# Patient Record
Sex: Female | Born: 1990 | Race: White | Hispanic: No | Marital: Single | Smoking: Never smoker
Health system: Southern US, Community
[De-identification: ages and names within clinical notes are randomized; demographics above are authoritative.]

## PROBLEM LIST (undated history)

## (undated) HISTORY — PX: TONSILLECTOMY: SUR1361

---
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
## 2008-12-31 ENCOUNTER — Ambulatory Visit (HOSPITAL_COMMUNITY): Admission: RE | Admit: 2008-12-31 | Discharge: 2008-12-31 | Payer: Self-pay | Admitting: Obstetrics

## 2009-06-18 ENCOUNTER — Inpatient Hospital Stay (HOSPITAL_COMMUNITY): Admission: AD | Admit: 2009-06-18 | Discharge: 2009-06-21 | Payer: Self-pay | Admitting: Obstetrics

## 2010-03-30 LAB — RPR: RPR Ser Ql: NONREACTIVE

## 2010-03-30 LAB — CBC
HCT: 27.9 % — ABNORMAL LOW (ref 36.0–46.0)
HCT: 31.7 % — ABNORMAL LOW (ref 36.0–46.0)
Hemoglobin: 10.9 g/dL — ABNORMAL LOW (ref 12.0–15.0)
Hemoglobin: 9.9 g/dL — ABNORMAL LOW (ref 12.0–15.0)
MCHC: 35.6 g/dL (ref 30.0–36.0)
MCV: 95.6 fL (ref 78.0–100.0)
RBC: 2.92 MIL/uL — ABNORMAL LOW (ref 3.87–5.11)
RBC: 3.3 MIL/uL — ABNORMAL LOW (ref 3.87–5.11)
RDW: 13.3 % (ref 11.5–15.5)
RDW: 13.4 % (ref 11.5–15.5)
WBC: 10.2 10*3/uL (ref 4.0–10.5)

## 2010-07-25 IMAGING — US US OB DETAIL+14 WK
1 series · 14 of 28 positions shown · non-contrast
Comparison: none

OBSTETRICAL ULTRASOUND:
 This ultrasound exam was performed in the [HOSPITAL] Ultrasound Department.  The OB US report was generated in the AS system, and faxed to the ordering physician.  This report is also available in [HOSPITAL]?s Access[employer]ware and in [REDACTED] PACS.

[Series 1: us ob detail +14 wk · 0.19mm/px · 98 acquisitions, 14 frames shown]
[im 4/98]
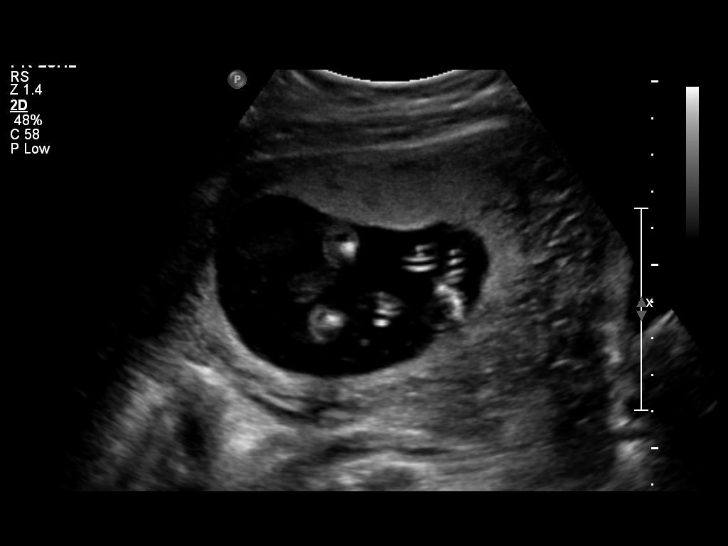
[im 11/98]
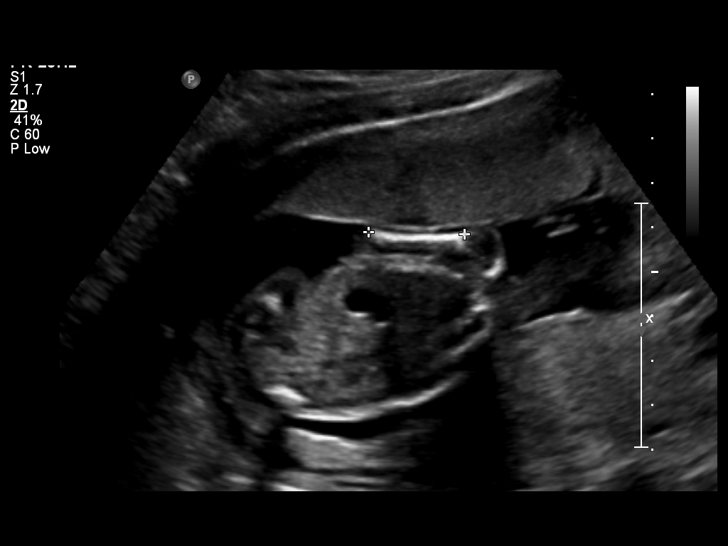
[im 18/98]
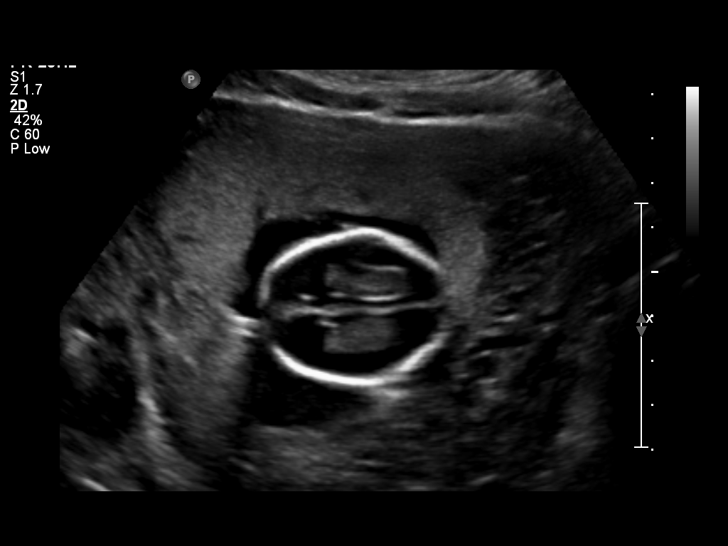
[im 26/98]
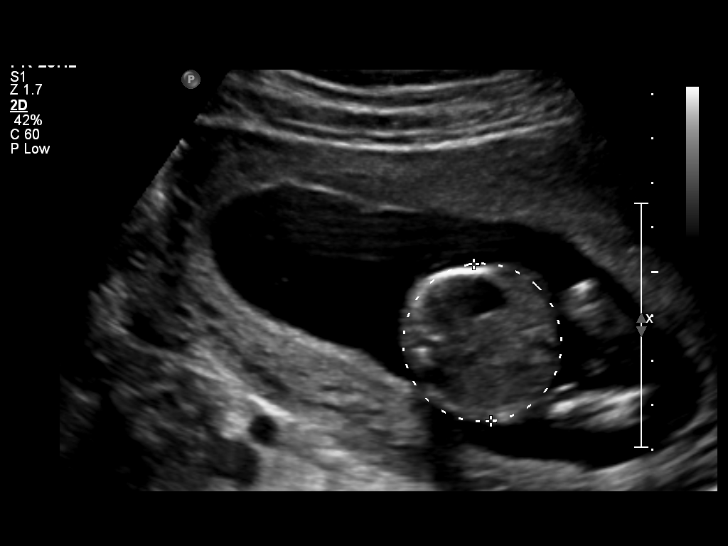
[im 33/98]
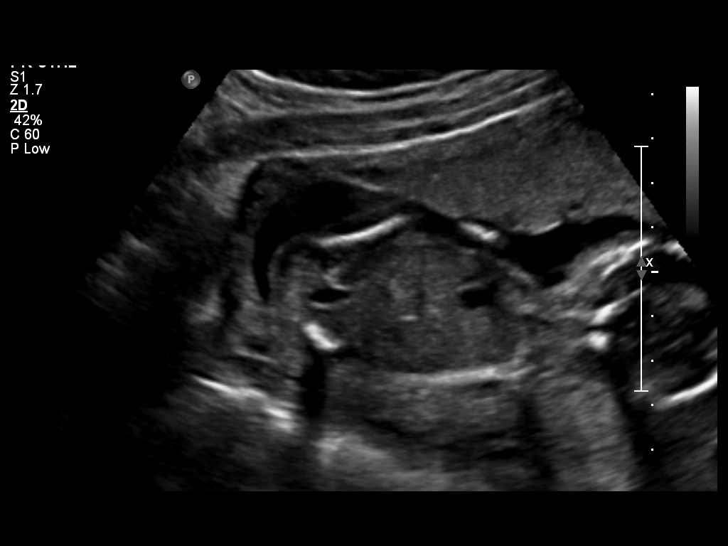
[im 40/98]
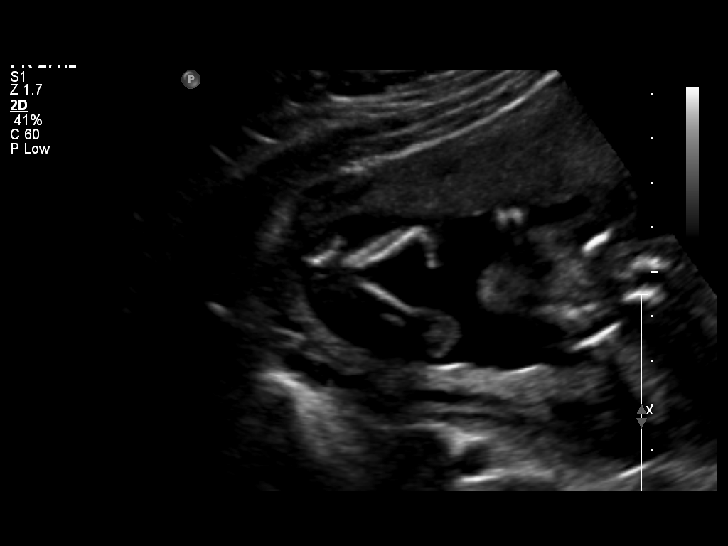
[im 47/98]
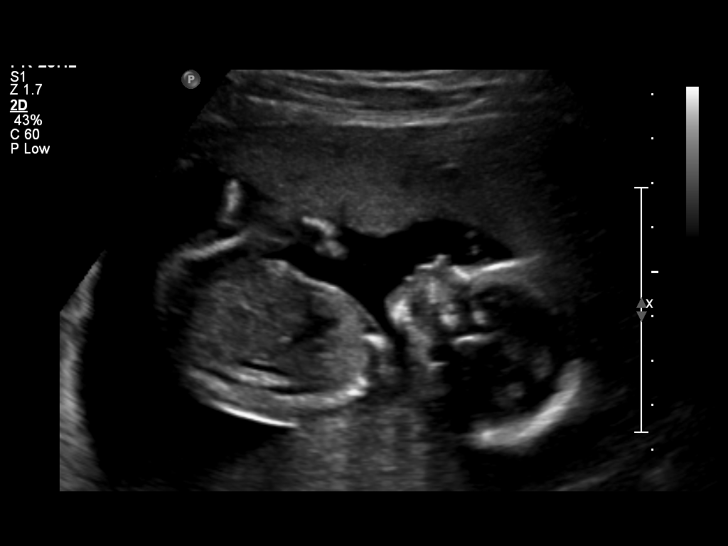
[im 54/98]
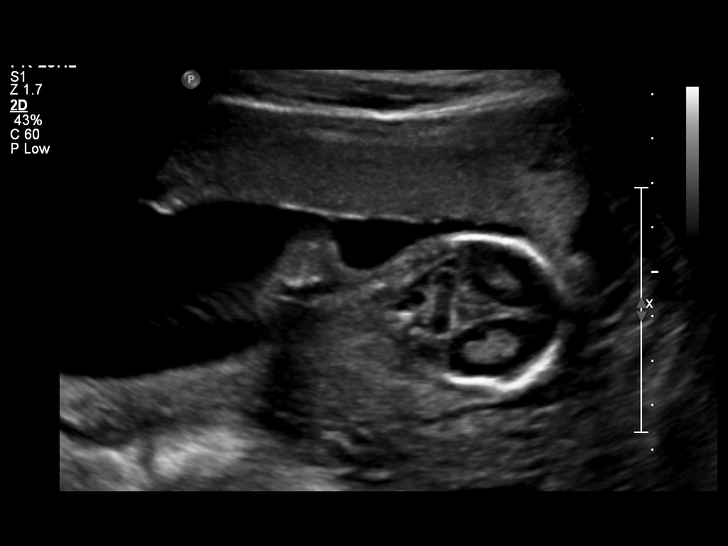
[im 62/98]
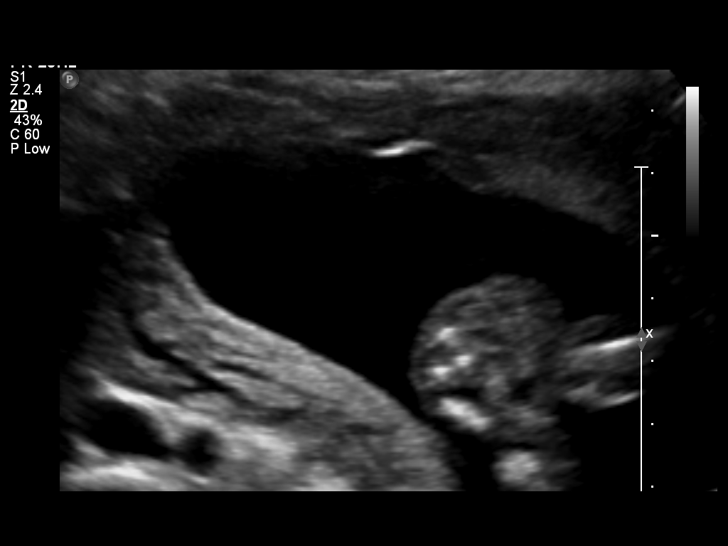
[im 69/98]
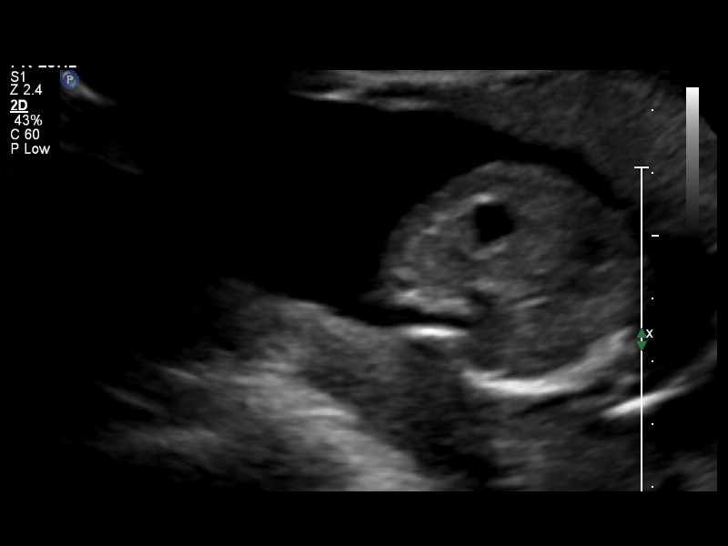
[im 76/98]
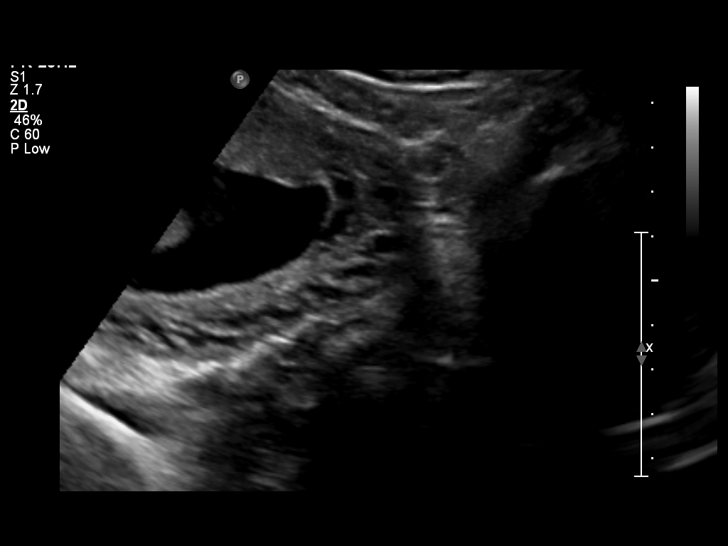
[im 83/98]
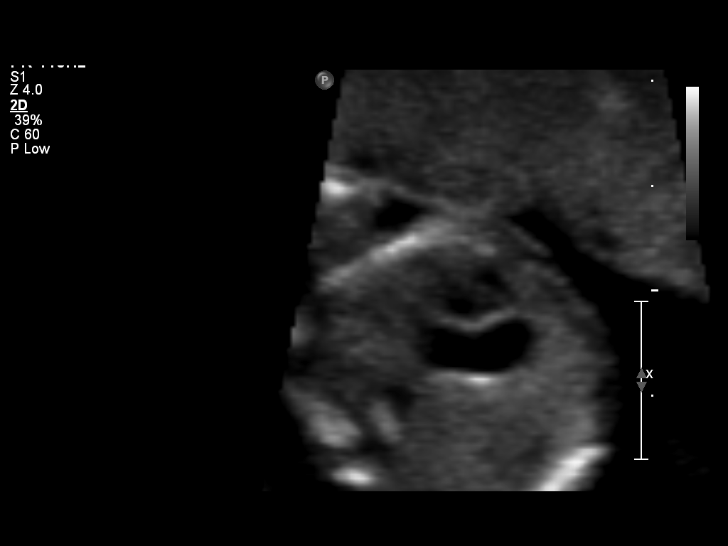
[im 90/98]
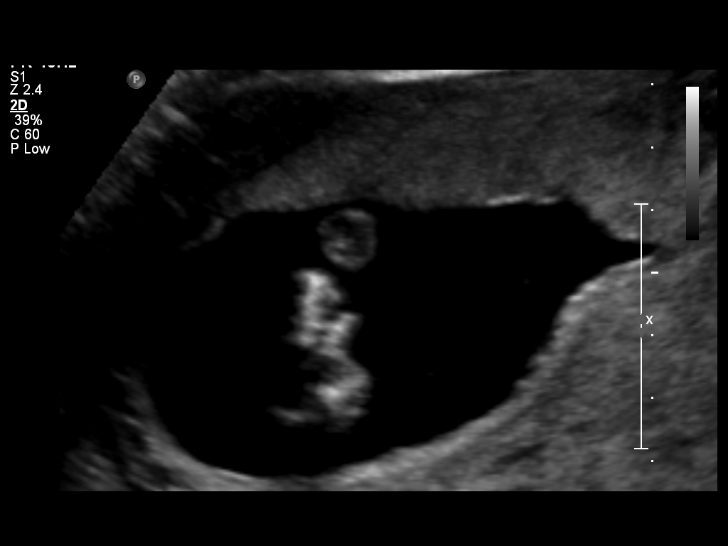
[im 98/98]
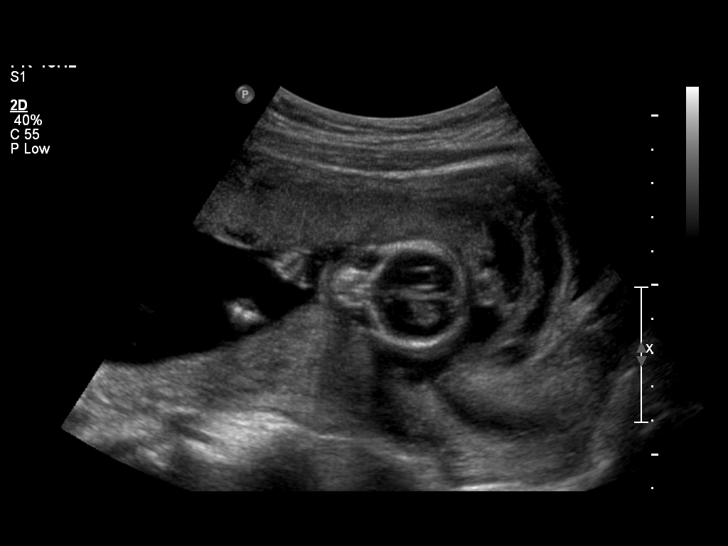

[14 of 28 positions shown; findings below may reference images not displayed]

IMPRESSION: See AS Obstetric US report.

## 2012-01-12 NOTE — L&D Delivery Note (Signed)
Delivery Note At 11:53 PM a viable female was delivered via Vaginal, Spontaneous Delivery (Presentation: Left Occiput Anterior).  APGAR: 8, 9; weight: 3005 grams .   Placenta status: Intact, Expressed.  Cord: 3 vessels with the following complications: None.  Cord pH: none  Anesthesia: Epidural  Episiotomy: None Lacerations: 1st degree Suture Repair: 3.0 vicryl Est. Blood Loss (mL): 350  Mom to postpartum.  Baby to skin to skin with mom immediately postpartum, then to nursery per protocol.[name]  Diamond [name] A 11/01/2012, 12:29 AM

## 2012-08-07 ENCOUNTER — Ambulatory Visit (INDEPENDENT_AMBULATORY_CARE_PROVIDER_SITE_OTHER): Payer: Medicaid Other | Admitting: Obstetrics

## 2012-08-07 ENCOUNTER — Encounter: Payer: Self-pay | Admitting: Obstetrics

## 2012-08-07 VITALS — BP 117/75 | Temp 98.6°F | Ht 60.0 in | Wt 159.0 lb

## 2012-08-07 DIAGNOSIS — Z3482 Encounter for supervision of other normal pregnancy, second trimester: Secondary | ICD-10-CM

## 2012-08-07 DIAGNOSIS — Z369 Encounter for antenatal screening, unspecified: Secondary | ICD-10-CM

## 2012-08-07 DIAGNOSIS — A6000 Herpesviral infection of urogenital system, unspecified: Secondary | ICD-10-CM

## 2012-08-07 DIAGNOSIS — Z3483 Encounter for supervision of other normal pregnancy, third trimester: Secondary | ICD-10-CM

## 2012-08-07 DIAGNOSIS — Z113 Encounter for screening for infections with a predominantly sexual mode of transmission: Secondary | ICD-10-CM

## 2012-08-07 DIAGNOSIS — Z3480 Encounter for supervision of other normal pregnancy, unspecified trimester: Secondary | ICD-10-CM

## 2012-08-07 DIAGNOSIS — A6009 Herpesviral infection of other urogenital tract: Secondary | ICD-10-CM

## 2012-08-07 LAB — POCT URINALYSIS DIPSTICK
Bilirubin, UA: NEGATIVE
Blood, UA: NEGATIVE
Ketones, UA: NEGATIVE
pH, UA: 7

## 2012-08-07 MED ORDER — VITAFOL ULTRA 29-0.6-0.4-200 MG PO CAPS: 1.0000 | ORAL_CAPSULE | Freq: Every day | ORAL | Status: DC

## 2012-08-07 MED ORDER — VALACYCLOVIR HCL 1 G PO TABS: 1000.0000 mg | ORAL_TABLET | Freq: Every day | ORAL | Status: DC

## 2012-08-07 NOTE — Progress Notes (Signed)
P- 94 Pt states she is having pelvic ain. Pt states she is also having a greenish mucousy discharge.  Subjective:    Diamond [name] is being seen today for her first obstetrical visit.  This is not a planned pregnancy. She is at [redacted]w[redacted]d gestation. Her obstetrical history is significant for none. Relationship with FOB: significant other, not living together. Patient does not intend to breast feed. Pregnancy history fully reviewed.  Menstrual History: OB History   Grav Para Term Preterm Abortions TAB SAB Ect Mult Living   2 1 1       1       Last Pap: 2013- WNL Menarche age: 68 Regular  Patient's last menstrual period was 01/18/2012.    The following portions of the patient's history were reviewed and updated as appropriate: allergies, current medications, past family history, past medical history, past social history, past surgical history and problem list.  Review of Systems Pertinent items are noted in HPI.    Objective:    General appearance: alert and no distress Abdomen: normal findings: soft, non-tender Pelvic: cervix normal in appearance, external genitalia normal, no adnexal masses or tenderness, no cervical motion tenderness, vagina normal without discharge and uterus enlarged, soft, NT.    Assessment:    Pregnancy at [redacted]w[redacted]d weeks    Plan:    Initial labs drawn. Prenatal vitamins.  Counseling provided regarding continued use of seat belts, cessation of alcohol consumption, smoking or use of illicit drugs; infection precautions i.e., influenza/TDAP immunizations, toxoplasmosis,CMV, parvovirus, listeria and varicella; workplace safety, exercise during pregnancy; routine dental care, safe medications, sexual activity, hot tubs, saunas, pools, travel, caffeine use, fish and methlymercury, potential toxins, hair treatments, varicose veins Weight gain recommendations reviewed: underweight/BMI< 18.5--> gain 28 - 40 lbs; normal weight/BMI 18.5 - 24.9--> gain 25 - 35 lbs;  overweight/BMI 25 - 29.9--> gain 15 - 25 lbs; obese/BMI >30->gain  11 - 20 lbs Problem list reviewed and updated. AFP3 discussed: n/a. Role of ultrasound in pregnancy discussed; fetal survey: requested. Amniocentesis discussed: not indicated. Follow up in 2 weeks. 50% of 20 min visit spent on counseling and coordination of care.

## 2012-08-08 ENCOUNTER — Ambulatory Visit (INDEPENDENT_AMBULATORY_CARE_PROVIDER_SITE_OTHER): Payer: Medicaid Other

## 2012-08-08 ENCOUNTER — Other Ambulatory Visit: Payer: Medicaid Other

## 2012-08-08 ENCOUNTER — Encounter: Payer: Self-pay | Admitting: Obstetrics

## 2012-08-08 DIAGNOSIS — Z3480 Encounter for supervision of other normal pregnancy, unspecified trimester: Secondary | ICD-10-CM

## 2012-08-08 DIAGNOSIS — Z369 Encounter for antenatal screening, unspecified: Secondary | ICD-10-CM

## 2012-08-08 DIAGNOSIS — Z3482 Encounter for supervision of other normal pregnancy, second trimester: Secondary | ICD-10-CM

## 2012-08-08 LAB — GLUCOSE TOLERANCE, 2 HOURS W/ 1HR
Glucose, 1 hour: 117 mg/dL (ref 70–170)
Glucose, Fasting: 64 mg/dL — ABNORMAL LOW (ref 70–99)

## 2012-08-08 LAB — VARICELLA ZOSTER ANTIBODY, IGG: Varicella IgG: 209.5 Index — ABNORMAL HIGH (ref <135.00)

## 2012-08-08 LAB — OBSTETRIC PANEL
Basophils Relative: 0 % (ref 0–1)
Eosinophils Absolute: 0.1 10*3/uL (ref 0.0–0.7)
Eosinophils Relative: 2 % (ref 0–5)
Lymphs Abs: 2.6 10*3/uL (ref 0.7–4.0)
MCH: 30.5 pg (ref 26.0–34.0)
MCHC: 32.7 g/dL (ref 30.0–36.0)
MCV: 93.1 fL (ref 78.0–100.0)
Neutrophils Relative %: 60 % (ref 43–77)
Platelets: 280 10*3/uL (ref 150–400)
RBC: 3.48 MIL/uL — ABNORMAL LOW (ref 3.87–5.11)
Rh Type: POSITIVE

## 2012-08-08 LAB — CBC
MCH: 31.2 pg (ref 26.0–34.0)
MCHC: 34.4 g/dL (ref 30.0–36.0)
Platelets: 268 10*3/uL (ref 150–400)
RDW: 13.6 % (ref 11.5–15.5)

## 2012-08-08 LAB — HIV ANTIBODY (ROUTINE TESTING W REFLEX)
HIV: NONREACTIVE
HIV: NONREACTIVE

## 2012-08-08 LAB — WET PREP BY MOLECULAR PROBE
Candida species: NEGATIVE
Trichomonas vaginosis: NEGATIVE

## 2012-08-08 LAB — VITAMIN D 25 HYDROXY (VIT D DEFICIENCY, FRACTURES): Vit D, 25-Hydroxy: 47 ng/mL (ref 30–89)

## 2012-08-08 LAB — US OB DETAIL + 14 WK

## 2012-08-08 LAB — CULTURE, OB URINE: Organism ID, Bacteria: NO GROWTH

## 2012-08-14 ENCOUNTER — Ambulatory Visit (INDEPENDENT_AMBULATORY_CARE_PROVIDER_SITE_OTHER): Payer: Medicaid Other | Admitting: Obstetrics

## 2012-08-14 VITALS — BP 117/69 | Temp 97.4°F | Wt 159.0 lb

## 2012-08-14 DIAGNOSIS — Z3483 Encounter for supervision of other normal pregnancy, third trimester: Secondary | ICD-10-CM

## 2012-08-14 DIAGNOSIS — Z3480 Encounter for supervision of other normal pregnancy, unspecified trimester: Secondary | ICD-10-CM

## 2012-08-14 LAB — POCT URINALYSIS DIPSTICK
Bilirubin, UA: NEGATIVE
Glucose, UA: NEGATIVE
Leukocytes, UA: NEGATIVE
Nitrite, UA: NEGATIVE
pH, UA: 6

## 2012-08-14 NOTE — Progress Notes (Signed)
Plans COCP.  Not planning to breast feed.

## 2012-08-14 NOTE — Progress Notes (Signed)
Pulse-102 No complaints.

## 2012-08-14 NOTE — Patient Instructions (Signed)

## 2012-08-28 ENCOUNTER — Encounter: Payer: Medicaid Other | Admitting: Obstetrics

## 2012-09-12 ENCOUNTER — Ambulatory Visit (INDEPENDENT_AMBULATORY_CARE_PROVIDER_SITE_OTHER): Payer: Medicaid Other | Admitting: Obstetrics

## 2012-09-12 VITALS — BP 119/75 | Temp 97.8°F | Wt 160.0 lb

## 2012-09-12 DIAGNOSIS — Z3483 Encounter for supervision of other normal pregnancy, third trimester: Secondary | ICD-10-CM

## 2012-09-12 DIAGNOSIS — Z3480 Encounter for supervision of other normal pregnancy, unspecified trimester: Secondary | ICD-10-CM

## 2012-09-12 LAB — POCT URINALYSIS DIPSTICK

## 2012-09-12 NOTE — Progress Notes (Signed)
Pulse: 89

## 2012-09-14 ENCOUNTER — Encounter: Payer: Self-pay | Admitting: Obstetrics

## 2012-09-19 ENCOUNTER — Ambulatory Visit (INDEPENDENT_AMBULATORY_CARE_PROVIDER_SITE_OTHER): Payer: Medicaid Other | Admitting: Obstetrics

## 2012-09-19 ENCOUNTER — Encounter: Payer: Self-pay | Admitting: Obstetrics

## 2012-09-19 VITALS — BP 113/76 | Temp 98.0°F | Wt 159.6 lb

## 2012-09-19 DIAGNOSIS — Z3483 Encounter for supervision of other normal pregnancy, third trimester: Secondary | ICD-10-CM

## 2012-09-19 DIAGNOSIS — Z3480 Encounter for supervision of other normal pregnancy, unspecified trimester: Secondary | ICD-10-CM

## 2012-09-19 LAB — POCT URINALYSIS DIPSTICK
Blood, UA: NEGATIVE
Protein, UA: NEGATIVE
Spec Grav, UA: 1.01
Urobilinogen, UA: NEGATIVE

## 2012-09-19 NOTE — Addendum Note (Signed)
Addended by: Elby Beck F on: 09/19/2012 02:46 PM   Modules accepted: Orders

## 2012-09-19 NOTE — Progress Notes (Signed)
Pulse: 85

## 2012-09-21 LAB — STREP B DNA PROBE: GBSP: NEGATIVE

## 2012-09-26 ENCOUNTER — Ambulatory Visit (INDEPENDENT_AMBULATORY_CARE_PROVIDER_SITE_OTHER): Payer: Medicaid Other | Admitting: Obstetrics

## 2012-09-26 VITALS — BP 108/67 | Temp 96.9°F | Wt 161.0 lb

## 2012-09-26 DIAGNOSIS — Z3483 Encounter for supervision of other normal pregnancy, third trimester: Secondary | ICD-10-CM

## 2012-09-26 DIAGNOSIS — Z3480 Encounter for supervision of other normal pregnancy, unspecified trimester: Secondary | ICD-10-CM

## 2012-09-26 LAB — POCT URINALYSIS DIPSTICK
Bilirubin, UA: NEGATIVE
Blood, UA: NEGATIVE
Ketones, UA: NEGATIVE
Spec Grav, UA: 1.015
pH, UA: 7

## 2012-09-26 NOTE — Progress Notes (Signed)
P 94 Patient reports she is doing well.

## 2012-09-27 ENCOUNTER — Encounter: Payer: Self-pay | Admitting: Obstetrics

## 2012-10-03 ENCOUNTER — Ambulatory Visit (INDEPENDENT_AMBULATORY_CARE_PROVIDER_SITE_OTHER): Payer: Medicaid Other | Admitting: Obstetrics

## 2012-10-03 ENCOUNTER — Encounter: Payer: Self-pay | Admitting: Obstetrics

## 2012-10-03 VITALS — Temp 97.0°F

## 2012-10-03 DIAGNOSIS — Z3480 Encounter for supervision of other normal pregnancy, unspecified trimester: Secondary | ICD-10-CM

## 2012-10-03 DIAGNOSIS — Z3483 Encounter for supervision of other normal pregnancy, third trimester: Secondary | ICD-10-CM

## 2012-10-03 LAB — POCT URINALYSIS DIPSTICK
Bilirubin, UA: NEGATIVE
Blood, UA: NEGATIVE
Glucose, UA: NEGATIVE
Ketones, UA: NEGATIVE
Spec Grav, UA: 1.01

## 2012-10-03 NOTE — Progress Notes (Signed)
P=79 

## 2012-10-10 ENCOUNTER — Encounter: Payer: Self-pay | Admitting: Obstetrics

## 2012-10-10 ENCOUNTER — Ambulatory Visit (INDEPENDENT_AMBULATORY_CARE_PROVIDER_SITE_OTHER): Payer: Medicaid Other | Admitting: Obstetrics

## 2012-10-10 VITALS — BP 112/70 | Temp 98.1°F | Wt 159.0 lb

## 2012-10-10 DIAGNOSIS — Z3483 Encounter for supervision of other normal pregnancy, third trimester: Secondary | ICD-10-CM

## 2012-10-10 DIAGNOSIS — Z3480 Encounter for supervision of other normal pregnancy, unspecified trimester: Secondary | ICD-10-CM

## 2012-10-10 LAB — POCT URINALYSIS DIPSTICK
Glucose, UA: NEGATIVE
Ketones, UA: NEGATIVE
Spec Grav, UA: 1.015
Urobilinogen, UA: NEGATIVE

## 2012-10-10 NOTE — Progress Notes (Signed)
P 79 Patient reports she is doing well.

## 2012-10-17 ENCOUNTER — Ambulatory Visit (INDEPENDENT_AMBULATORY_CARE_PROVIDER_SITE_OTHER): Payer: Medicaid Other | Admitting: Obstetrics

## 2012-10-17 ENCOUNTER — Encounter: Payer: Self-pay | Admitting: Obstetrics

## 2012-10-17 VITALS — BP 112/71 | Temp 97.8°F | Wt 158.8 lb

## 2012-10-17 DIAGNOSIS — Z3480 Encounter for supervision of other normal pregnancy, unspecified trimester: Secondary | ICD-10-CM

## 2012-10-17 DIAGNOSIS — Z3483 Encounter for supervision of other normal pregnancy, third trimester: Secondary | ICD-10-CM

## 2012-10-17 LAB — POCT URINALYSIS DIPSTICK
Bilirubin, UA: NEGATIVE
Ketones, UA: NEGATIVE
Nitrite, UA: NEGATIVE
Protein, UA: NEGATIVE

## 2012-10-17 NOTE — Progress Notes (Signed)
Pulse- 101 

## 2012-10-24 ENCOUNTER — Inpatient Hospital Stay (HOSPITAL_COMMUNITY): Admission: AD | Admit: 2012-10-24 | Payer: Self-pay | Source: Ambulatory Visit | Admitting: Obstetrics

## 2012-10-24 ENCOUNTER — Ambulatory Visit (INDEPENDENT_AMBULATORY_CARE_PROVIDER_SITE_OTHER): Payer: Medicaid Other | Admitting: Obstetrics

## 2012-10-24 ENCOUNTER — Encounter: Payer: Self-pay | Admitting: Obstetrics

## 2012-10-24 VITALS — BP 121/79 | Temp 97.5°F | Wt 160.0 lb

## 2012-10-24 DIAGNOSIS — Z3483 Encounter for supervision of other normal pregnancy, third trimester: Secondary | ICD-10-CM

## 2012-10-24 DIAGNOSIS — Z3480 Encounter for supervision of other normal pregnancy, unspecified trimester: Secondary | ICD-10-CM

## 2012-10-24 LAB — POCT URINALYSIS DIPSTICK
Bilirubin, UA: NEGATIVE
Nitrite, UA: NEGATIVE
Urobilinogen, UA: NEGATIVE
pH, UA: 7

## 2012-10-24 NOTE — Progress Notes (Signed)
Pulse: 93

## 2012-10-30 ENCOUNTER — Ambulatory Visit (INDEPENDENT_AMBULATORY_CARE_PROVIDER_SITE_OTHER): Payer: Medicaid Other | Admitting: Obstetrics

## 2012-10-30 ENCOUNTER — Encounter: Payer: Self-pay | Admitting: Obstetrics

## 2012-10-30 VITALS — BP 116/75 | Temp 97.9°F | Wt 159.0 lb

## 2012-10-30 DIAGNOSIS — Z3483 Encounter for supervision of other normal pregnancy, third trimester: Secondary | ICD-10-CM

## 2012-10-30 DIAGNOSIS — Z3480 Encounter for supervision of other normal pregnancy, unspecified trimester: Secondary | ICD-10-CM

## 2012-10-30 LAB — POCT URINALYSIS DIPSTICK
Blood, UA: NEGATIVE
Glucose, UA: NEGATIVE

## 2012-10-30 NOTE — Progress Notes (Signed)
P 103 Patient is ready to deliver her baby

## 2012-10-31 ENCOUNTER — Inpatient Hospital Stay (HOSPITAL_COMMUNITY): Payer: Medicaid Other | Admitting: Anesthesiology

## 2012-10-31 ENCOUNTER — Encounter (HOSPITAL_COMMUNITY): Payer: Medicaid Other | Admitting: Anesthesiology

## 2012-10-31 ENCOUNTER — Encounter (HOSPITAL_COMMUNITY): Payer: Self-pay

## 2012-10-31 ENCOUNTER — Inpatient Hospital Stay (HOSPITAL_COMMUNITY)
Admission: RE | Admit: 2012-10-31 | Discharge: 2012-11-02 | DRG: 775 | Disposition: A | Discharge status: Home / Self Care | Payer: Medicaid Other | Source: Ambulatory Visit | Attending: Obstetrics | Admitting: Obstetrics

## 2012-10-31 DIAGNOSIS — O480 Post-term pregnancy: Principal | ICD-10-CM | POA: Diagnosis present

## 2012-10-31 LAB — RPR: RPR Ser Ql: NONREACTIVE

## 2012-10-31 LAB — CBC
HCT: 29.8 % — ABNORMAL LOW (ref 36.0–46.0)
Hemoglobin: 10.4 g/dL — ABNORMAL LOW (ref 12.0–15.0)
MCHC: 34.9 g/dL (ref 30.0–36.0)
RBC: 3.34 MIL/uL — ABNORMAL LOW (ref 3.87–5.11)
RDW: 13.1 % (ref 11.5–15.5)
WBC: 8 10*3/uL (ref 4.0–10.5)

## 2012-10-31 MED ORDER — CITRIC ACID-SODIUM CITRATE 334-500 MG/5ML PO SOLN: 30.0000 mL | ORAL | Status: DC | PRN

## 2012-10-31 MED ORDER — LACTATED RINGERS IV SOLN
500.0000 mL | Freq: Once | INTRAVENOUS | Status: AC
  Administered 2012-10-31: 1000 mL via INTRAVENOUS

## 2012-10-31 MED ORDER — EPHEDRINE 5 MG/ML INJ
10.0000 mg | INTRAVENOUS | Status: DC | PRN
  Filled 2012-10-31: qty 2

## 2012-10-31 MED ORDER — LIDOCAINE HCL (PF) 1 % IJ SOLN
30.0000 mL | INTRAMUSCULAR | Status: DC | PRN
  Filled 2012-10-31 (×2): qty 30

## 2012-10-31 MED ORDER — ONDANSETRON HCL 4 MG/2ML IJ SOLN: 4.0000 mg | Freq: Four times a day (QID) | INTRAMUSCULAR | Status: DC | PRN

## 2012-10-31 MED ORDER — MISOPROSTOL 25 MCG QUARTER TABLET
25.0000 ug | ORAL_TABLET | ORAL | Status: DC | PRN
  Administered 2012-10-31: 25 ug via VAGINAL
  Filled 2012-10-31: qty 1
  Filled 2012-10-31: qty 0.25

## 2012-10-31 MED ORDER — TERBUTALINE SULFATE 1 MG/ML IJ SOLN: 0.2500 mg | Freq: Once | INTRAMUSCULAR | Status: AC | PRN

## 2012-10-31 MED ORDER — IBUPROFEN 600 MG PO TABS
600.0000 mg | ORAL_TABLET | Freq: Four times a day (QID) | ORAL | Status: DC | PRN
  Administered 2012-11-01: 600 mg via ORAL
  Filled 2012-10-31: qty 1

## 2012-10-31 MED ORDER — LACTATED RINGERS IV SOLN
INTRAVENOUS | Status: DC
  Administered 2012-10-31: 08:00:00 via INTRAVENOUS

## 2012-10-31 MED ORDER — OXYTOCIN 40 UNITS IN LACTATED RINGERS INFUSION - SIMPLE MED
1.0000 m[IU]/min | INTRAVENOUS | Status: DC
  Administered 2012-10-31: 1 m[IU]/min via INTRAVENOUS
  Filled 2012-10-31: qty 1000

## 2012-10-31 MED ORDER — LIDOCAINE HCL (PF) 1 % IJ SOLN
INTRAMUSCULAR | Status: DC | PRN
  Administered 2012-10-31 (×2): 5 mL

## 2012-10-31 MED ORDER — DIPHENHYDRAMINE HCL 50 MG/ML IJ SOLN: 12.5000 mg | INTRAMUSCULAR | Status: DC | PRN

## 2012-10-31 MED ORDER — OXYTOCIN BOLUS FROM INFUSION
500.0000 mL | INTRAVENOUS | Status: DC
  Administered 2012-10-31: 500 mL via INTRAVENOUS

## 2012-10-31 MED ORDER — ACETAMINOPHEN 325 MG PO TABS: 650.0000 mg | ORAL_TABLET | ORAL | Status: DC | PRN

## 2012-10-31 MED ORDER — FENTANYL 2.5 MCG/ML BUPIVACAINE 1/10 % EPIDURAL INFUSION (WH - ANES)
14.0000 mL/h | INTRAMUSCULAR | Status: DC | PRN
  Administered 2012-10-31: 14 mL/h via EPIDURAL
  Filled 2012-10-31: qty 125

## 2012-10-31 MED ORDER — OXYTOCIN 40 UNITS IN LACTATED RINGERS INFUSION - SIMPLE MED
62.5000 mL/h | INTRAVENOUS | Status: DC
  Administered 2012-11-01: 62.5 mL/h via INTRAVENOUS

## 2012-10-31 MED ORDER — LACTATED RINGERS IV SOLN: 500.0000 mL | INTRAVENOUS | Status: DC | PRN

## 2012-10-31 MED ORDER — OXYCODONE-ACETAMINOPHEN 5-325 MG PO TABS: 1.0000 | ORAL_TABLET | ORAL | Status: DC | PRN

## 2012-10-31 MED ORDER — EPHEDRINE 5 MG/ML INJ
10.0000 mg | INTRAVENOUS | Status: DC | PRN
  Filled 2012-10-31: qty 4
  Filled 2012-10-31: qty 2

## 2012-10-31 MED ORDER — PHENYLEPHRINE 40 MCG/ML (10ML) SYRINGE FOR IV PUSH (FOR BLOOD PRESSURE SUPPORT)
80.0000 ug | PREFILLED_SYRINGE | INTRAVENOUS | Status: DC | PRN
  Filled 2012-10-31: qty 2
  Filled 2012-10-31: qty 5

## 2012-10-31 MED ORDER — NALBUPHINE SYRINGE 5 MG/0.5 ML: 5.0000 mg | INJECTION | INTRAMUSCULAR | Status: DC | PRN

## 2012-10-31 MED ORDER — PHENYLEPHRINE 40 MCG/ML (10ML) SYRINGE FOR IV PUSH (FOR BLOOD PRESSURE SUPPORT)
80.0000 ug | PREFILLED_SYRINGE | INTRAVENOUS | Status: DC | PRN
  Filled 2012-10-31: qty 2

## 2012-10-31 NOTE — Progress Notes (Signed)
Provider call to check in on pt.  Updated provider on uc pattern, pitocin, fhr, vag exam.

## 2012-10-31 NOTE — Anesthesia Procedure Notes (Signed)
Epidural Patient location during procedure: OB Start time: 10/31/2012 4:38 PM  Staffing Anesthesiologist: Angus Seller., Harrell Gave. Performed by: anesthesiologist   Preanesthetic Checklist Completed: patient identified, site marked, surgical consent, pre-op evaluation, timeout performed, IV checked, risks and benefits discussed and monitors and equipment checked  Epidural Patient position: sitting Prep: site prepped and draped and DuraPrep Patient monitoring: continuous pulse ox and blood pressure Approach: midline Injection technique: LOR air  Needle:  Needle type: Tuohy  Needle gauge: 17 G Needle length: 9 cm and 9 Needle insertion depth: 5 cm cm Catheter type: closed end flexible Catheter size: 19 Gauge Catheter at skin depth: 10 cm Test dose: negative  Assessment Events: blood not aspirated, injection not painful, no injection resistance, negative IV test and no paresthesia  Additional Notes Patient identified.  Risk benefits discussed including failed block, incomplete pain control, headache, nerve damage, paralysis, blood pressure changes, nausea, vomiting, reactions to medication both toxic or allergic, and postpartum back pain.  Patient expressed understanding and wished to proceed.  All questions were answered.  Sterile technique used throughout procedure and epidural site dressed with sterile barrier dressing. No paresthesia or other complications noted.The patient did not experience any signs of intravascular injection such as tinnitus or metallic taste in mouth nor signs of intrathecal spread such as rapid motor block. Please see nursing notes for vital signs.

## 2012-10-31 NOTE — Anesthesia Preprocedure Evaluation (Signed)

## 2012-10-31 NOTE — Progress Notes (Signed)
Provider calls to check in on pt.  Updated on vag exam, uc pattern, fhr.

## 2012-10-31 NOTE — Progress Notes (Signed)
Diamond [name] is a 22 y.o. G2P1001 at [redacted]w[redacted]d by LMP admitted for induction of labor due to Post dates. Due date 10-24-12.  Subjective:   Objective: BP 97/51  Pulse 72  Temp(Src) 98.4 F (36.9 C) (Oral)  Resp 18  Ht 5' (1.524 m)  Wt 159 lb (72.122 kg)  BMI 31.05 kg/m2  SpO2 100%  LMP 01/18/2012      FHT:  FHR: 150-160 bpm, variability: moderate,  accelerations:  Present,  decelerations:  Present variables UC:   regular, every 2 minutes SVE:   Dilation: 9 Effacement (%): 100 Station: 0;+1 Exam by:: B. Cagna,RN  Labs: Lab Results  Component Value Date   WBC 8.0 10/31/2012   HGB 10.4* 10/31/2012   HCT 29.8* 10/31/2012   MCV 89.2 10/31/2012   PLT 249 10/31/2012    Assessment / Plan: Induction of labor due to postdates,  progressing well on pitocin  Labor: Progressing normally Preeclampsia:  n/a Fetal Wellbeing:  Category I Pain Control:  Epidural I/D:  n/a Anticipated MOD:  NSVD  Damita Eppard A 10/31/2012, 11:32 PM

## 2012-11-01 ENCOUNTER — Encounter (HOSPITAL_COMMUNITY): Payer: Self-pay

## 2012-11-01 LAB — CBC
HCT: 28.8 % — ABNORMAL LOW (ref 36.0–46.0)
MCH: 31.3 pg (ref 26.0–34.0)
MCV: 92 fL (ref 78.0–100.0)
Platelets: 238 10*3/uL (ref 150–400)
RBC: 3.13 MIL/uL — ABNORMAL LOW (ref 3.87–5.11)
RDW: 13 % (ref 11.5–15.5)
WBC: 13.1 10*3/uL — ABNORMAL HIGH (ref 4.0–10.5)

## 2012-11-01 MED ORDER — ONDANSETRON HCL 4 MG/2ML IJ SOLN: 4.0000 mg | INTRAMUSCULAR | Status: DC | PRN

## 2012-11-01 MED ORDER — OXYCODONE-ACETAMINOPHEN 5-325 MG PO TABS: 1.0000 | ORAL_TABLET | ORAL | Status: DC | PRN

## 2012-11-01 MED ORDER — IBUPROFEN 600 MG PO TABS
600.0000 mg | ORAL_TABLET | Freq: Four times a day (QID) | ORAL | Status: DC
  Administered 2012-11-01 – 2012-11-02 (×4): 600 mg via ORAL
  Filled 2012-11-01 (×4): qty 1

## 2012-11-01 MED ORDER — PRENATAL MULTIVITAMIN CH
1.0000 | ORAL_TABLET | Freq: Every day | ORAL | Status: DC
  Administered 2012-11-01: 1 via ORAL
  Filled 2012-11-01: qty 1

## 2012-11-01 MED ORDER — SENNOSIDES-DOCUSATE SODIUM 8.6-50 MG PO TABS
2.0000 | ORAL_TABLET | ORAL | Status: DC
  Administered 2012-11-01: 2 via ORAL
  Filled 2012-11-01: qty 2

## 2012-11-01 MED ORDER — INFLUENZA VAC SPLIT QUAD 0.5 ML IM SUSP
0.5000 mL | INTRAMUSCULAR | Status: AC
  Administered 2012-11-01: 0.5 mL via INTRAMUSCULAR
  Filled 2012-11-01: qty 0.5

## 2012-11-01 MED ORDER — LANOLIN HYDROUS EX OINT: TOPICAL_OINTMENT | CUTANEOUS | Status: DC | PRN

## 2012-11-01 MED ORDER — TETANUS-DIPHTH-ACELL PERTUSSIS 5-2.5-18.5 LF-MCG/0.5 IM SUSP
0.5000 mL | Freq: Once | INTRAMUSCULAR | Status: AC
  Administered 2012-11-01: 0.5 mL via INTRAMUSCULAR

## 2012-11-01 MED ORDER — SIMETHICONE 80 MG PO CHEW: 80.0000 mg | CHEWABLE_TABLET | ORAL | Status: DC | PRN

## 2012-11-01 MED ORDER — DIBUCAINE 1 % RE OINT: 1.0000 "application " | TOPICAL_OINTMENT | RECTAL | Status: DC | PRN

## 2012-11-01 MED ORDER — ONDANSETRON HCL 4 MG PO TABS: 4.0000 mg | ORAL_TABLET | ORAL | Status: DC | PRN

## 2012-11-01 MED ORDER — DIPHENHYDRAMINE HCL 25 MG PO CAPS: 25.0000 mg | ORAL_CAPSULE | Freq: Four times a day (QID) | ORAL | Status: DC | PRN

## 2012-11-01 MED ORDER — WITCH HAZEL-GLYCERIN EX PADS: 1.0000 "application " | MEDICATED_PAD | CUTANEOUS | Status: DC | PRN

## 2012-11-01 MED ORDER — OXYTOCIN 40 UNITS IN LACTATED RINGERS INFUSION - SIMPLE MED: 62.5000 mL/h | INTRAVENOUS | Status: DC | PRN

## 2012-11-01 MED ORDER — BENZOCAINE-MENTHOL 20-0.5 % EX AERO
1.0000 "application " | INHALATION_SPRAY | CUTANEOUS | Status: DC | PRN
  Filled 2012-11-01: qty 56

## 2012-11-01 MED ORDER — ZOLPIDEM TARTRATE 5 MG PO TABS: 5.0000 mg | ORAL_TABLET | Freq: Every evening | ORAL | Status: DC | PRN

## 2012-11-01 MED ORDER — MEDROXYPROGESTERONE ACETATE 150 MG/ML IM SUSP: 150.0000 mg | INTRAMUSCULAR | Status: DC | PRN

## 2012-11-01 NOTE — Progress Notes (Signed)
Post Partum Day 1 Subjective: no complaints  Objective: Blood pressure 102/66, pulse 76, temperature 98.1 F (36.7 C), temperature source Oral, resp. rate 18, height 5' (1.524 m), weight 159 lb (72.122 kg), last menstrual period 01/18/2012, SpO2 98.00%, unknown if currently breastfeeding.  Physical Exam:  General: alert and no distress Lochia: appropriate Uterine Fundus: firm Incision: none DVT Evaluation: No evidence of DVT seen on physical exam.   Recent Labs  10/31/12 0940 11/01/12 0620  HGB 10.4* 9.8*  HCT 29.8* 28.8*    Assessment/Plan: Plan for discharge tomorrow   LOS: 1 day   Candita Borenstein A 11/01/2012, 7:07 AM

## 2012-11-01 NOTE — Progress Notes (Signed)
UR completed 

## 2012-11-01 NOTE — Lactation Note (Signed)
This note was copied from the chart of Girl [facility]. Lactation Consultation Note  Patient Name: Girl Alessia Gonsalez [id number] Date: 11/01/2012 Reason for consult: Initial assessment;Other (Comment) (charting for exclusion)   Maternal Data Formula Feeding for Exclusion: Yes Reason for exclusion: Mother's choice to forumla feed on admision  Feeding    LATCH Score/Interventions                      Lactation Tools Discussed/Used     Consult Status Consult Status: Complete    Lynda Rainwater 11/01/2012, 3:45 PM

## 2012-11-01 NOTE — H&P (Signed)
Diamond [name] is a 22 y.o. female presenting for IOL. Maternal Medical History:  Reason for admission: 22 yo G2 P1.  EDC 10-24-12.  Presents for IOL for postdates.  Prenatal Complications - Diabetes: none.    OB History   Grav Para Term Preterm Abortions TAB SAB Ect Mult Living   2 2 2       2      History reviewed. No pertinent past medical history. Past Surgical History  Procedure Laterality Date  . Tonsillectomy     Family History: family history includes Congestive Heart Failure in her mother. Social History:  reports that she has never smoked. She does not have any smokeless tobacco history on file. She reports that she does not drink alcohol or use illicit drugs.   Prenatal Transfer Tool  Maternal Diabetes: No Genetic Screening: Normal Maternal Ultrasounds/Referrals: Normal Fetal Ultrasounds or other Referrals:  None Maternal Substance Abuse:  No Significant Maternal Medications:  Meds include: Other:  Valtrex Significant Maternal Lab Results:  None Other Comments:  None  Review of Systems  All other systems reviewed and are negative.      Maternal Exam:  Abdomen: Patient reports no abdominal tenderness. Introitus: Normal vulva. Normal vagina.  Pelvis: adequate for delivery.   Cervix: Cervix evaluated by digital exam.     Physical Exam  Nursing note and vitals reviewed. Constitutional: She is oriented to person, place, and time. She appears well-developed and well-nourished.  HENT:  Head: Normocephalic and atraumatic.  Eyes: Conjunctivae are normal. Pupils are equal, round, and reactive to light.  Neck: Normal range of motion. Neck supple.  Cardiovascular: Normal rate and regular rhythm.   Respiratory: Effort normal and breath sounds normal.  GI: Soft.  Musculoskeletal: Normal range of motion.  Neurological: She is alert and oriented to person, place, and time.  Skin: Skin is warm and dry.  Psychiatric: She has a normal mood and affect. Her behavior is  normal. Judgment and thought content normal.    Prenatal labs: ABO, Rh: A/POS/-- (07/28 1610) Antibody: NEG (07/28 9604) Rubella: 4.79 (07/28 0937) RPR: NON REACTIVE (10/21 0940)  HBsAg: NEGATIVE (07/28 5409)  HIV: NON REACTIVE (07/29 1032)  GBS: NEGATIVE (09/09 1454)   Assessment/Plan: 41 weeks.  2 stage IOL.   Berry Gallacher A 11/01/2012, 12:50 AM

## 2012-11-01 NOTE — Progress Notes (Signed)
Diamond [name] is [name] 22 y.o. [id number] at [redacted]w[redacted]d by LMP admitted for induction of labor due to Post dates. Due date 10-24-12.  Subjective:   Objective: BP 140/68  Pulse 99  Temp(Src) 98.4 F (36.9 C) (Oral)  Resp 18  Ht 5' (1.524 m)  Wt 159 lb (72.122 kg)  BMI 31.05 kg/m2  SpO2 100%  LMP 01/18/2012   Total I/O In: -  Out: 350 [Blood:350]  FHT:  FHR: 150-160 bpm, variability: moderate,  accelerations:  Present,  decelerations:  Present variables UC:   regular, every 2 minutes SVE:   Dilation: 10 Effacement (%): 100 Station: +2 Exam by:: B.Cagna,RN  Labs: Lab Results  Component Value Date   WBC 8.0 10/31/2012   HGB 10.4* 10/31/2012   HCT 29.8* 10/31/2012   MCV 89.2 10/31/2012   PLT 249 10/31/2012    Assessment / Plan: Induction of labor due to postdates,  progressing well on pitocin  Labor: Progressing normally Preeclampsia:  n/[name] Fetal Wellbeing:  Category I Pain Control:  Epidural I/D:  n/[name] Anticipated MOD:  NSVD  Diamond [name] 11/01/2012, 12:27 AM

## 2012-11-01 NOTE — Anesthesia Postprocedure Evaluation (Signed)
  Anesthesia Post-op Note  Patient: Diamond [name]  Procedure(s) Performed: * No procedures listed *  Patient Location: Mother/Baby  Anesthesia Type:Epidural  Level of Consciousness: awake, alert , oriented and patient cooperative  Airway and Oxygen Therapy: Patient Spontanous Breathing  Post-op Pain: mild  Post-op Assessment: Patient's Cardiovascular Status Stable, Respiratory Function Stable, No headache, No backache, No residual numbness and No residual motor weakness  Post-op Vital Signs: stable  Complications: No apparent anesthesia complications

## 2012-11-02 MED ORDER — NORGESTIM-ETH ESTRAD TRIPHASIC 0.18/0.215/0.25 MG-25 MCG PO TABS: 1.0000 | ORAL_TABLET | Freq: Every day | ORAL | Status: DC

## 2012-11-02 NOTE — Progress Notes (Signed)
[name]: no complaints  Objective: Blood pressure 95/61, pulse 69, temperature 97.9 F (36.6 C), temperature source Oral, resp. rate 18, height 5' (1.524 m), weight 159 lb (72.122 kg), last menstrual period 01/18/2012, SpO2 98.00%, unknown if currently breastfeeding.  Physical Exam:  General: alert and no distress Lochia: appropriate Uterine Fundus: firm Incision: none  DVT Evaluation: No evidence of DVT seen on physical exam.   Recent Labs  10/31/12 0940 11/01/12 0620  HGB 10.4* 9.8*  HCT 29.8* 28.8*    Assessment/Plan: Discharge home   LOS: 2 days   Diamond [name] A 11/02/2012, 8:23 AM

## 2012-11-02 NOTE — Discharge Summary (Signed)
  Obstetric Discharge Summary Reason for Admission: induction of labor Prenatal Procedures: none Intrapartum Procedures: spontaneous vaginal delivery Postpartum Procedures: none Complications-Operative and Postpartum: none  Hemoglobin  Date Value Range Status  11/01/2012 9.8* 12.0 - 15.0 g/dL Final     HCT  Date Value Range Status  11/01/2012 28.8* 36.0 - 46.0 % Final    Physical Exam:  General: alert Lochia: appropriate Uterine: firm Incision: n/[name] DVT Evaluation: No evidence of DVT seen on physical exam.  Discharge Diagnoses: Active Problems:   Normal delivery   Discharge Information: Date: 11/02/2012 Activity: pelvic rest Diet: routine Medications:  Prior to Admission medications   Medication Sig Start Date End Date Taking? Authorizing Provider  Prenatal Vit-Fe Fumarate-FA (PRENATAL MULTIVITAMIN) TABS tablet Take 1 tablet by mouth daily at 12 noon.   Yes Historical Provider, MD  valACYclovir (VALTREX) 1000 MG tablet Take 1 tablet (1,000 mg total) by mouth daily. 08/07/12  Yes Brock Bad, MD  Norgestimate-Ethinyl Estradiol Triphasic 0.18/0.215/0.25 MG-25 MCG tab Take 1 tablet by mouth daily. Start in 3 weeks. 11/02/12   Antionette Char, MD    Condition: stable Instructions: refer to routine discharge instructions Discharge to: home Follow-up Information   Follow up with HARPER,CHARLES A, MD. Schedule an appointment as soon as possible for [name] visit in 2 weeks.   Specialty:  Obstetrics and Gynecology   Contact information:   [address] [place] [address] [phone]       Newborn Data:  Live born female  Birth Weight: 6 lb 10 oz (3005 g) APGAR: 8, 9   Home with mother.  Diamond [name],Diamond [name] 11/02/2012, 8:34 AM

## 2012-11-14 ENCOUNTER — Encounter: Payer: Self-pay | Admitting: Obstetrics

## 2012-11-14 ENCOUNTER — Ambulatory Visit (INDEPENDENT_AMBULATORY_CARE_PROVIDER_SITE_OTHER): Payer: Medicaid Other | Admitting: Obstetrics

## 2012-11-14 DIAGNOSIS — Z3009 Encounter for other general counseling and advice on contraception: Secondary | ICD-10-CM

## 2012-11-14 NOTE — Progress Notes (Signed)
Subjective:     Diamond [name] is a 22 y.o. female who presents for a postpartum visit. She is 2 weeks postpartum following a spontaneous vaginal delivery. I have fully reviewed the prenatal and intrapartum course. The delivery was at 41 gestational weeks. Outcome: spontaneous vaginal delivery. Anesthesia: epidural. Postpartum course has been WNL. Baby's course has been WNL. Baby is feeding by bottle - Gerber Gentle. Bleeding thin lochia. Bowel function is normal. Bladder function is normal. Patient is not sexually active. Contraception method is abstinence. Postpartum depression screening: negative.  The following portions of the patient's history were reviewed and updated as appropriate: allergies, current medications, past family history, past medical history, past social history, past surgical history and problem list.  Review of Systems Pertinent items are noted in HPI.   Objective:    BP 122/82  Pulse 69  Temp(Src) 98.6 F (37 C)  Wt 143 lb (64.864 kg)  Breastfeeding? No  General:  no distress   Breasts:  inspection negative, no nipple discharge or bleeding, no masses or nodularity palpable  Abdomen:  Soft, NT.                                 Assessment:     Normal postpartum exam. Pap smear not done at today's visit.   Plan:    1. Contraception: OCP (estrogen/progesterone) 2. Continue PNV's 3. Follow up in: 4 weeks or as needed.

## 2012-12-12 ENCOUNTER — Encounter: Payer: Self-pay | Admitting: Obstetrics

## 2012-12-12 ENCOUNTER — Ambulatory Visit (INDEPENDENT_AMBULATORY_CARE_PROVIDER_SITE_OTHER): Payer: Medicaid Other | Admitting: Obstetrics

## 2012-12-12 DIAGNOSIS — Z0000 Encounter for general adult medical examination without abnormal findings: Secondary | ICD-10-CM

## 2012-12-12 DIAGNOSIS — Z3202 Encounter for pregnancy test, result negative: Secondary | ICD-10-CM

## 2012-12-12 LAB — POCT URINALYSIS DIPSTICK
Spec Grav, UA: 1.02
Urobilinogen, UA: NEGATIVE

## 2012-12-12 LAB — POCT URINE PREGNANCY: Preg Test, Ur: NEGATIVE

## 2012-12-12 NOTE — Progress Notes (Signed)
Subjective:     Diamond [name] is a 22 y.o. female who presents for a postpartum visit. She is 6 weeks postpartum following a spontaneous vaginal delivery. I have fully reviewed the prenatal and intrapartum course. The delivery was at 41 gestational weeks. Outcome: spontaneous vaginal delivery. Anesthesia: epidural. Postpartum course has been normal. Baby's course has been normal. Baby is feeding by bottle - Lucien Mons Start. Bleeding currently menstruating. Bowel function is normal. Bladder function is normal. Patient is not sexually active. Contraception method is OCP (estrogen/progesterone). Postpartum depression screening: negative.  The following portions of the patient's history were reviewed and updated as appropriate: allergies, current medications, past family history, past medical history, past social history, past surgical history and problem list.  Review of Systems Pertinent items are noted in HPI.   Objective:    BP 146/78  Pulse 94  Temp(Src) 98.8 F (37.1 C)  Ht 5' (1.524 m)  Wt 139 lb (63.05 kg)  BMI 27.15 kg/m2  LMP 12/08/2012  Breastfeeding? No  General:  alert and no distress   Breasts:  inspection negative, no nipple discharge or bleeding, no masses or nodularity palpable Abdomen:  Soft, NT.   Uterus NSSC, NT.  Adnexa negative.   Assessment:     Normal  postpartum exam. Pap smear not done at today's visit.   Plan:    1. Contraception: OCP (estrogen/progesterone) 2. Continue PNV's 3. Follow up in: several months or as needed.

## 2013-05-07 ENCOUNTER — Other Ambulatory Visit: Payer: Self-pay | Admitting: Obstetrics

## 2013-05-07 ENCOUNTER — Other Ambulatory Visit: Payer: Self-pay | Admitting: *Deleted

## 2013-05-07 DIAGNOSIS — Z309 Encounter for contraceptive management, unspecified: Secondary | ICD-10-CM

## 2013-05-07 MED ORDER — NORGESTIMATE-ETH ESTRADIOL 0.25-35 MG-MCG PO TABS: 1.0000 | ORAL_TABLET | Freq: Every day | ORAL | Status: DC

## 2013-05-08 ENCOUNTER — Encounter: Payer: Self-pay | Admitting: Obstetrics

## 2013-11-12 ENCOUNTER — Encounter: Payer: Self-pay | Admitting: Obstetrics

## 2014-04-02 ENCOUNTER — Other Ambulatory Visit: Payer: Self-pay | Admitting: *Deleted

## 2014-04-02 DIAGNOSIS — Z3041 Encounter for surveillance of contraceptive pills: Secondary | ICD-10-CM

## 2014-04-02 MED ORDER — NORGESTIMATE-ETH ESTRADIOL 0.25-35 MG-MCG PO TABS: 1.0000 | ORAL_TABLET | Freq: Every day | ORAL | Status: DC

## 2014-06-05 ENCOUNTER — Ambulatory Visit (INDEPENDENT_AMBULATORY_CARE_PROVIDER_SITE_OTHER): Payer: Medicaid Other | Admitting: Obstetrics

## 2014-06-05 ENCOUNTER — Encounter: Payer: Self-pay | Admitting: Obstetrics

## 2014-06-05 VITALS — BP 127/83 | HR 78 | Temp 97.0°F | Ht 60.0 in | Wt 131.0 lb

## 2014-06-05 DIAGNOSIS — Z3041 Encounter for surveillance of contraceptive pills: Secondary | ICD-10-CM

## 2014-06-05 DIAGNOSIS — Z01419 Encounter for gynecological examination (general) (routine) without abnormal findings: Secondary | ICD-10-CM | POA: Diagnosis not present

## 2014-06-05 DIAGNOSIS — Z0000 Encounter for general adult medical examination without abnormal findings: Secondary | ICD-10-CM | POA: Diagnosis not present

## 2014-06-05 DIAGNOSIS — N939 Abnormal uterine and vaginal bleeding, unspecified: Secondary | ICD-10-CM

## 2014-06-05 MED ORDER — NORETHINDRONE-ETH ESTRADIOL 1-35 MG-MCG PO TABS: 1.0000 | ORAL_TABLET | Freq: Every day | ORAL | Status: DC

## 2014-06-05 MED ORDER — OB COMPLETE PETITE 35-5-1-200 MG PO CAPS: 1.0000 | ORAL_CAPSULE | Freq: Every day | ORAL | Status: AC

## 2014-06-05 NOTE — Progress Notes (Addendum)
Subjective:        Diamond [name] is a 24 y.o. female here for a routine exam.  Current complaints: Spotting in middle and end of cycle x ~ 7 months.  Denies vaginal discharge or pelvic pain.  Personal health questionnaire:  Is patient Ashkenazi Jewish, have a family history of breast and/or ovarian cancer: no Is there a family history of uterine cancer diagnosed at age < 78, gastrointestinal cancer, urinary tract cancer, family member who is a Personnel officer syndrome-associated carrier: no Is the patient overweight and hypertensive, family history of diabetes, personal history of gestational diabetes, preeclampsia or PCOS: no Is patient over 79, have PCOS,  family history of premature CHD under age 27, diabetes, smoke, have hypertension or peripheral artery disease:  no At any time, has a partner hit, kicked or otherwise hurt or frightened you?: no Over the past 2 weeks, have you felt down, depressed or hopeless?: no Over the past 2 weeks, have you felt little interest or pleasure in doing things?:no   Gynecologic History Patient's last menstrual period was 05/31/2014. Contraception: OCP (estrogen/progesterone) Last Pap: 2013. Results were: normal Last mammogram: n/a. Results were: n/a  Obstetric History OB History  Gravida Para Term Preterm AB SAB TAB Ectopic Multiple Living  # Outcome Date GA Lbr Len/2nd Weight Sex Delivery Anes PTL Lv  2 Term 10/31/12 [redacted]w[redacted]d / 00:21 6 lb 10 oz (3.005 kg) F Vag-Spont EPI  Y  1 Term 06/19/09 [redacted]w[redacted]d  7 lb 5 oz (3.317 kg) F Vag-Spont EPI  Y      History reviewed. No pertinent past medical history.  Past Surgical History  Procedure Laterality Date  . Tonsillectomy       Current outpatient prescriptions:  .  norethindrone-ethinyl estradiol 1/35 (ORTHO-NOVUM 1/35, 28,) tablet, Take 1 tablet by mouth daily., Disp: 1 Package, Rfl: 11 .  Prenat-FeCbn-FeAspGl-FA-Omega (OB COMPLETE PETITE) 35-5-1-200 MG CAPS, Take 1 capsule by mouth daily  before breakfast., Disp: 90 capsule, Rfl: 3 .  valACYclovir (VALTREX) 1000 MG tablet, Take 1 tablet (1,000 mg total) by mouth daily. (Patient not taking: Reported on 06/05/2014), Disp: 90 tablet, Rfl: 3 No Known Allergies  History  Substance Use Topics  . Smoking status: Never Smoker   . Smokeless tobacco: Never Used  . Alcohol Use: No    Family History  Problem Relation Age of Onset  . Congestive Heart Failure Mother       Review of Systems  Constitutional: negative for fatigue and weight loss Respiratory: negative for cough and wheezing Cardiovascular: negative for chest pain, fatigue and palpitations Gastrointestinal: negative for abdominal pain and change in bowel habits Musculoskeletal:negative for myalgias Neurological: negative for gait problems and tremors Behavioral/Psych: negative for abusive relationship, depression Endocrine: negative for temperature intolerance   Genitourinary:negative for abnormal menstrual periods, genital lesions, hot flashes, sexual problems and vaginal discharge Integument/breast: negative for breast lump, breast tenderness, nipple discharge and skin lesion(s)    Objective:       BP 127/83 mmHg  Pulse 78  Temp(Src) 97 F (36.1 C)  Ht 5' (1.524 m)  Wt 131 lb (59.421 kg)  BMI 25.58 kg/m2  LMP 05/31/2014 General:   alert  Skin:   no rash or abnormalities  Lungs:   clear to auscultation bilaterally  Heart:   regular rate and rhythm, S1, S2 normal, no murmur, click, rub or gallop  Breasts:   normal without suspicious masses,  skin or nipple changes or axillary nodes  Abdomen:  normal findings: no organomegaly, soft, non-tender and no hernia  Pelvis:  External genitalia: normal general appearance Urinary system: urethral meatus normal and bladder without fullness, nontender Vaginal: normal without tenderness, induration or masses Cervix: normal appearance Adnexa: normal bimanual exam Uterus: anteverted and non-tender, normal size   Lab  Review Urine pregnancy test Labs reviewed yes Radiologic studies reviewed no    Assessment:    Healthy female exam.    AUB  Contraceptive management  Plan:   D/C Sprintec 28.  Start Ortho Novum 1/35.   Education reviewed: low fat, low cholesterol diet, safe sex/STD prevention, self breast exams and weight bearing exercise. Contraception: OCP (estrogen/progesterone). Follow up in: 1 year.   Meds ordered this encounter  Medications  . norethindrone-ethinyl estradiol 1/35 (ORTHO-NOVUM 1/35, 28,) tablet    Sig: Take 1 tablet by mouth daily.    Dispense:  1 Package    Refill:  11  . Prenat-FeCbn-FeAspGl-FA-Omega (OB COMPLETE PETITE) 35-5-1-200 MG CAPS    Sig: Take 1 capsule by mouth daily before breakfast.    Dispense:  90 capsule    Refill:  3   Orders Placed This Encounter  Procedures  . SureSwab, Vaginosis/Vaginitis Plus

## 2014-06-06 ENCOUNTER — Other Ambulatory Visit: Payer: Self-pay | Admitting: *Deleted

## 2014-06-06 DIAGNOSIS — A6009 Herpesviral infection of other urogenital tract: Secondary | ICD-10-CM

## 2014-06-06 LAB — PAP IG W/ RFLX HPV ASCU

## 2014-06-06 MED ORDER — VALACYCLOVIR HCL 1 G PO TABS: 1000.0000 mg | ORAL_TABLET | Freq: Every day | ORAL | Status: DC

## 2014-06-08 LAB — SURESWAB, VAGINOSIS/VAGINITIS PLUS
Atopobium vaginae: NOT DETECTED Log (cells/mL)
C. ALBICANS, DNA: NOT DETECTED
C. TROPICALIS, DNA: NOT DETECTED
C. glabrata, DNA: NOT DETECTED
C. parapsilosis, DNA: NOT DETECTED
C. trachomatis RNA, TMA: NOT DETECTED
GARDNERELLA VAGINALIS: 7.7 Log (cells/mL)
LACTOBACILLUS SPECIES: NOT DETECTED Log (cells/mL)
MEGASPHAERA SPECIES: NOT DETECTED Log (cells/mL)
N. gonorrhoeae RNA, TMA: NOT DETECTED
T. vaginalis RNA, QL TMA: NOT DETECTED

## 2014-06-09 ENCOUNTER — Other Ambulatory Visit: Payer: Self-pay | Admitting: Obstetrics

## 2014-06-09 DIAGNOSIS — B9689 Other specified bacterial agents as the cause of diseases classified elsewhere: Secondary | ICD-10-CM

## 2014-06-09 DIAGNOSIS — N760 Acute vaginitis: Principal | ICD-10-CM

## 2014-06-09 MED ORDER — TINIDAZOLE 500 MG PO TABS: 1000.0000 mg | ORAL_TABLET | Freq: Every day | ORAL | Status: AC

## 2015-04-29 ENCOUNTER — Telehealth: Payer: Self-pay | Admitting: *Deleted

## 2015-04-29 DIAGNOSIS — Z3041 Encounter for surveillance of contraceptive pills: Secondary | ICD-10-CM

## 2015-04-29 MED ORDER — NORETHINDRONE-ETH ESTRADIOL 1-35 MG-MCG PO TABS: 1.0000 | ORAL_TABLET | Freq: Every day | ORAL | Status: AC

## 2015-04-29 NOTE — Telephone Encounter (Signed)
Patient is requesting a refill of OCP. Refill for OCP sent to pharmacy- patient needs Annual exam in May

## 2016-11-15 ENCOUNTER — Encounter: Payer: Self-pay | Admitting: Obstetrics

## 2016-11-15 ENCOUNTER — Ambulatory Visit: Payer: Medicaid Other | Admitting: Obstetrics

## 2016-11-15 VITALS — BP 133/83 | HR 98 | Ht 60.0 in | Wt 132.6 lb

## 2016-11-15 DIAGNOSIS — Z3009 Encounter for other general counseling and advice on contraception: Secondary | ICD-10-CM

## 2016-11-15 DIAGNOSIS — Z3202 Encounter for pregnancy test, result negative: Secondary | ICD-10-CM | POA: Diagnosis not present

## 2016-11-15 DIAGNOSIS — Z30011 Encounter for initial prescription of contraceptive pills: Secondary | ICD-10-CM | POA: Diagnosis not present

## 2016-11-15 LAB — POCT URINE PREGNANCY: PREG TEST UR: NEGATIVE

## 2016-11-15 MED ORDER — NORGESTIMATE-ETH ESTRADIOL 0.25-35 MG-MCG PO TABS: 1.0000 | ORAL_TABLET | Freq: Every day | ORAL | 11 refills | Status: DC

## 2016-11-15 NOTE — Progress Notes (Signed)
Patient ID: Diamond GowdaMegan M Schnider, female   DOB: 07/18/1990, 26 y.o.   MRN: [id number]  Chief Complaint  Patient presents with  . Contraception    HPI Diamond [name] is a 26 y.o. female.  Desires to resume OCP's. HPI  History reviewed. No pertinent past medical history.  Past Surgical History:  Procedure Laterality Date  . TONSILLECTOMY      Family History  Problem Relation Age of Onset  . Congestive Heart Failure Mother     Social History Social History   Tobacco Use  . Smoking status: Never Smoker  . Smokeless tobacco: Never Used  Substance Use Topics  . Alcohol use: No    Alcohol/week: 0.0 oz  . Drug use: No    No Known Allergies  Current Outpatient Medications  Medication Sig Dispense Refill  . norethindrone-ethinyl estradiol 1/35 (ORTHO-NOVUM 1/35, 28,) tablet Take 1 tablet by mouth daily. (Patient not taking: Reported on 11/15/2016) 1 Package 3  . norgestimate-ethinyl estradiol (ORTHO-CYCLEN,SPRINTEC,PREVIFEM) 0.25-35 MG-MCG tablet Take 1 tablet daily by mouth. 1 Package 11  . Prenat-FeCbn-FeAspGl-FA-Omega (OB COMPLETE PETITE) 35-5-1-200 MG CAPS Take 1 capsule by mouth daily before breakfast. (Patient not taking: Reported on 11/15/2016) 90 capsule 3  . tinidazole (TINDAMAX) 500 MG tablet Take 2 tablets (1,000 mg total) by mouth daily with breakfast. (Patient not taking: Reported on 11/15/2016) 10 tablet 2  . valACYclovir (VALTREX) 1000 MG tablet Take 1 tablet (1,000 mg total) by mouth daily. (Patient not taking: Reported on 11/15/2016) 90 tablet 3   No current facility-administered medications for this visit.     Review of Systems Review of Systems Constitutional: negative for fatigue and weight loss Respiratory: negative for cough and wheezing Cardiovascular: negative for chest pain, fatigue and palpitations Gastrointestinal: negative for abdominal pain and change in bowel habits Genitourinary:negative Integument/breast: negative for nipple  discharge Musculoskeletal:negative for myalgias Neurological: negative for gait problems and tremors Behavioral/Psych: negative for abusive relationship, depression Endocrine: negative for temperature intolerance      Blood pressure 133/83, pulse 98, height 5' (1.524 m), weight 132 lb 9.6 oz (60.1 kg), last menstrual period 10/22/2016.  Physical Exam Physical Exam:  Deferred  >50% of 10 min visit spent on counseling and coordination of care.    Data Reviewed UPT  Assessment      Plan    1. Encounter for other general counseling and advice on contraception   2. Encounter for initial prescription of contraceptive pills Rx: - POCT urine pregnancy - norgestimate-ethinyl estradiol (ORTHO-CYCLEN,SPRINTEC,PREVIFEM) 0.25-35 MG-MCG tablet; Take 1 tablet daily by mouth.  Dispense: 1 Package; Refill: 11    Orders Placed This Encounter  Procedures  . POCT urine pregnancy   Meds ordered this encounter  Medications  . norgestimate-ethinyl estradiol (ORTHO-CYCLEN,SPRINTEC,PREVIFEM) 0.25-35 MG-MCG tablet    Sig: Take 1 tablet daily by mouth.    Dispense:  1 Package    Refill:  11

## 2016-11-15 NOTE — Progress Notes (Signed)
Presents for Contraception, she wants [facility] OCP.  Last unprotected sex was two days ago.  UPT today is  NEGATIVE. [facility].

## 2017-06-13 ENCOUNTER — Encounter: Payer: Self-pay | Admitting: Obstetrics

## 2017-06-13 ENCOUNTER — Ambulatory Visit (INDEPENDENT_AMBULATORY_CARE_PROVIDER_SITE_OTHER): Payer: Self-pay | Admitting: Obstetrics

## 2017-06-13 VITALS — BP 125/85 | HR 109 | Wt 131.0 lb

## 2017-06-13 DIAGNOSIS — N898 Other specified noninflammatory disorders of vagina: Secondary | ICD-10-CM

## 2017-06-13 DIAGNOSIS — Z113 Encounter for screening for infections with a predominantly sexual mode of transmission: Secondary | ICD-10-CM

## 2017-06-13 DIAGNOSIS — N946 Dysmenorrhea, unspecified: Secondary | ICD-10-CM

## 2017-06-13 DIAGNOSIS — J301 Allergic rhinitis due to pollen: Secondary | ICD-10-CM

## 2017-06-13 DIAGNOSIS — A6009 Herpesviral infection of other urogenital tract: Secondary | ICD-10-CM

## 2017-06-13 MED ORDER — IBUPROFEN 800 MG PO TABS: 800.0000 mg | ORAL_TABLET | Freq: Three times a day (TID) | ORAL | 5 refills | Status: AC | PRN

## 2017-06-13 MED ORDER — LORATADINE 10 MG PO TABS: 10.0000 mg | ORAL_TABLET | Freq: Every day | ORAL | 11 refills | Status: AC

## 2017-06-13 MED ORDER — FLUCONAZOLE 150 MG PO TABS: 150.0000 mg | ORAL_TABLET | Freq: Once | ORAL | 2 refills | Status: DC

## 2017-06-13 MED ORDER — VALACYCLOVIR HCL 1 G PO TABS: ORAL_TABLET | ORAL | 11 refills | Status: AC

## 2017-06-13 NOTE — Progress Notes (Signed)
Patient ID: Diamond [name], female   DOB: 03-21-1990, 27 y.o.   MRN: [id number]  Chief Complaint  Patient presents with  . Vaginitis    HPI Diamond [name] is a 27 y.o. female.  Vaginal burning and irritation for the past 4 days.  No significant increase in discharge or odor.  Denies dysuria, but the vaginal tissue does burn when urine passes over it. HPI  History reviewed. No pertinent past medical history.  Past Surgical History:  Procedure Laterality Date  . TONSILLECTOMY      Family History  Problem Relation Age of Onset  . Congestive Heart Failure Mother     Social History Social History   Tobacco Use  . Smoking status: Never Smoker  . Smokeless tobacco: Never Used  Substance Use Topics  . Alcohol use: No    Alcohol/week: 0.0 oz  . Drug use: No    No Known Allergies  Current Outpatient Medications  Medication Sig Dispense Refill  . norgestimate-ethinyl estradiol (ORTHO-CYCLEN,SPRINTEC,PREVIFEM) 0.25-35 MG-MCG tablet Take 1 tablet daily by mouth. 1 Package 11  . fluconazole (DIFLUCAN) 150 MG tablet Take 1 tablet (150 mg total) by mouth once for 1 dose. 1 tablet 2  . ibuprofen (ADVIL,MOTRIN) 800 MG tablet Take 1 tablet (800 mg total) by mouth every 8 (eight) hours as needed. 30 tablet 5  . loratadine (CLARITIN) 10 MG tablet Take 1 tablet (10 mg total) by mouth daily. 30 tablet 11  . norethindrone-ethinyl estradiol 1/35 (ORTHO-NOVUM 1/35, 28,) tablet Take 1 tablet by mouth daily. (Patient not taking: Reported on 11/15/2016) 1 Package 3  . Prenat-FeCbn-FeAspGl-FA-Omega (OB COMPLETE PETITE) 35-5-1-200 MG CAPS Take 1 capsule by mouth daily before breakfast. (Patient not taking: Reported on 11/15/2016) 90 capsule 3  . tinidazole (TINDAMAX) 500 MG tablet Take 2 tablets (1,000 mg total) by mouth daily with breakfast. (Patient not taking: Reported on 11/15/2016) 10 tablet 2  . valACYclovir (VALTREX) 1000 MG tablet Take 1 tablet po bid for 3 days prn 30 tablet 11   No current  facility-administered medications for this visit.     Review of Systems Review of Systems Constitutional: negative for fatigue and weight loss Respiratory: negative for cough and wheezing Cardiovascular: negative for chest pain, fatigue and palpitations Gastrointestinal: negative for abdominal pain and change in bowel habits Genitourinary: vaginal irritation and burning Integument/breast: negative for nipple discharge Musculoskeletal:negative for myalgias Neurological: negative for gait problems and tremors Behavioral/Psych: negative for abusive relationship, depression Endocrine: negative for temperature intolerance      Blood pressure 125/85, pulse (!) 109, weight 131 lb (59.4 kg), last menstrual period 05/30/2017.  Physical Exam Physical Exam General:   alert  Skin:   no rash or abnormalities  Lungs:   clear to auscultation bilaterally  Heart:   regular rate and rhythm, S1, S2 normal, no murmur, click, rub or gallop  Breasts:   normal without suspicious masses, skin or nipple changes or axillary nodes  Abdomen:  normal findings: no organomegaly, soft, non-tender and no hernia  Pelvis:  External genitalia: normal general appearance Urinary system: urethral meatus normal and bladder without fullness, nontender Vaginal: normal without tenderness, induration or masses Cervix: normal appearance Adnexa: normal bimanual exam Uterus: anteverted and non-tender, normal size    50% of 15 min visit spent on counseling and coordination of care.   Data Reviewed Labs  Assessment     1. Vaginal irritation Rx: - Cervicovaginal ancillary only - fluconazole (DIFLUCAN) 150 MG tablet; Take 1 tablet (150 mg  total) by mouth once for 1 dose.  Dispense: 1 tablet; Refill: 2  2. Vaginal discharge - scant, appears normal  3. Herpes genitalis in women Rx: - valACYclovir (VALTREX) 1000 MG tablet; Take 1 tablet po bid for 3 days prn  Dispense: 30 tablet; Refill: 11  4. Dysmenorrhea Rx: -  ibuprofen (ADVIL,MOTRIN) 800 MG tablet; Take 1 tablet (800 mg total) by mouth every 8 (eight) hours as needed.  Dispense: 30 tablet; Refill: 5  5. Seasonal allergic rhinitis due to pollen Rx: - loratadine (CLARITIN) 10 MG tablet; Take 1 tablet (10 mg total) by mouth daily.  Dispense: 30 tablet; Refill: 11    Plan    No orders of the defined types were placed in this encounter.  Meds ordered this encounter  Medications  . valACYclovir (VALTREX) 1000 MG tablet    Sig: Take 1 tablet po bid for 3 days prn    Dispense:  30 tablet    Refill:  11  . ibuprofen (ADVIL,MOTRIN) 800 MG tablet    Sig: Take 1 tablet (800 mg total) by mouth every 8 (eight) hours as needed.    Dispense:  30 tablet    Refill:  5  . fluconazole (DIFLUCAN) 150 MG tablet    Sig: Take 1 tablet (150 mg total) by mouth once for 1 dose.    Dispense:  1 tablet    Refill:  2  . loratadine (CLARITIN) 10 MG tablet    Sig: Take 1 tablet (10 mg total) by mouth daily.    Dispense:  30 tablet    Refill:  11    Brock BadHARLES A. Marjorie Lussier MD 06-13-2017

## 2017-06-13 NOTE — Progress Notes (Signed)
RGYN patient presents for problem visit today. CC: vaginal irritation and burning since Friday. Possible odor no discharge

## 2017-06-14 LAB — CERVICOVAGINAL ANCILLARY ONLY
Bacterial vaginitis: NEGATIVE
CANDIDA VAGINITIS: POSITIVE — AB
CHLAMYDIA, DNA PROBE: NEGATIVE
NEISSERIA GONORRHEA: NEGATIVE
TRICH (WINDOWPATH): NEGATIVE

## 2017-06-15 ENCOUNTER — Other Ambulatory Visit: Payer: Self-pay | Admitting: Obstetrics

## 2017-09-06 ENCOUNTER — Other Ambulatory Visit: Payer: Self-pay | Admitting: Obstetrics

## 2017-09-06 DIAGNOSIS — Z30011 Encounter for initial prescription of contraceptive pills: Secondary | ICD-10-CM

## 2017-09-06 MED ORDER — NORGESTIMATE-ETH ESTRADIOL 0.25-35 MG-MCG PO TABS: 1.0000 | ORAL_TABLET | Freq: Every day | ORAL | 11 refills | Status: DC

## 2018-07-03 ENCOUNTER — Other Ambulatory Visit: Payer: Self-pay | Admitting: Obstetrics

## 2018-07-03 DIAGNOSIS — N898 Other specified noninflammatory disorders of vagina: Secondary | ICD-10-CM

## 2018-07-05 ENCOUNTER — Other Ambulatory Visit: Payer: Self-pay | Admitting: Obstetrics

## 2018-07-05 DIAGNOSIS — Z30011 Encounter for initial prescription of contraceptive pills: Secondary | ICD-10-CM

## 2018-07-05 MED ORDER — NORGESTIMATE-ETH ESTRADIOL 0.25-35 MG-MCG PO TABS: 1.0000 | ORAL_TABLET | Freq: Every day | ORAL | 11 refills | Status: AC

## 2018-07-05 NOTE — Progress Notes (Signed)
Left VM message per providers request. Patient to pick up RX and make an Appt for PAP/AEX  OCP Rx. Please call patient and make an appointment for pap.

## 2022-12-29 ENCOUNTER — Encounter (HOSPITAL_BASED_OUTPATIENT_CLINIC_OR_DEPARTMENT_OTHER): Payer: Self-pay

## 2022-12-29 ENCOUNTER — Other Ambulatory Visit: Payer: Self-pay

## 2022-12-29 ENCOUNTER — Emergency Department (HOSPITAL_BASED_OUTPATIENT_CLINIC_OR_DEPARTMENT_OTHER)
Admission: EM | Admit: 2022-12-29 | Discharge: 2022-12-29 | Disposition: A | Discharge status: Home / Self Care | Payer: Self-pay | Attending: Emergency Medicine | Admitting: Emergency Medicine

## 2022-12-29 DIAGNOSIS — S61012A Laceration without foreign body of left thumb without damage to nail, initial encounter: Secondary | ICD-10-CM | POA: Insufficient documentation

## 2022-12-29 DIAGNOSIS — Z23 Encounter for immunization: Secondary | ICD-10-CM | POA: Insufficient documentation

## 2022-12-29 DIAGNOSIS — W268XXA Contact with other sharp object(s), not elsewhere classified, initial encounter: Secondary | ICD-10-CM | POA: Insufficient documentation

## 2022-12-29 DIAGNOSIS — R200 Anesthesia of skin: Secondary | ICD-10-CM | POA: Insufficient documentation

## 2022-12-29 MED ORDER — TETANUS-DIPHTH-ACELL PERTUSSIS 5-2.5-18.5 LF-MCG/0.5 IM SUSY
0.5000 mL | PREFILLED_SYRINGE | Freq: Once | INTRAMUSCULAR | Status: AC
  Administered 2022-12-29: 0.5 mL via INTRAMUSCULAR
  Filled 2022-12-29: qty 0.5

## 2022-12-29 MED ORDER — LIDOCAINE-EPINEPHRINE (PF) 2 %-1:200000 IJ SOLN
10.0000 mL | Freq: Once | INTRAMUSCULAR | Status: AC
  Administered 2022-12-29: 10 mL
  Filled 2022-12-29: qty 20

## 2022-12-29 NOTE — ED Triage Notes (Signed)
Cut left thumb last night.  States thumb feels numb  bleeding controlled

## 2022-12-29 NOTE — Discharge Instructions (Addendum)
You received sutures which are dissolvable. would place a bandage on the affected area and change it at least once daily, or more frequently if it becomes soiled. When changing bandage, clean the wound thoroughly with soap and water, and place a thin layer of Polysporin or Neosporin directly over top of the wound before replacing bandage. Please monitor for any signs of developing infection including worsening redness, pain, pus draining from the affected site.  If you are concerned about any developing infection I recommend that you return for further evaluation for management.

## 2022-12-29 NOTE — ED Provider Notes (Signed)
[place] EMERGENCY DEPARTMENT AT [facility] Provider Note   CSN: [id number] Arrival date & time: 12/29/22  2952     History  Chief Complaint  Patient presents with   Laceration    Diamond [name] is a 32 y.o. female with overall noncontributory past medical history who is not up to date on her tetanus who presents with laceration of left thumb last night on can she was trying to open. Reports feeling some numbness in the extremity. Bleeding controlled at this time.    Laceration      Home Medications Prior to Admission medications   Medication Sig Start Date End Date Taking? Authorizing Provider  fluconazole (DIFLUCAN) 150 MG tablet TAKE ONE TABLET BY MOUTH AS A ONE-TIME DOSE 07/04/18   Brock Bad, MD  ibuprofen (ADVIL,MOTRIN) 800 MG tablet Take 1 tablet (800 mg total) by mouth every 8 (eight) hours as needed. 06/13/17   Brock Bad, MD  loratadine (CLARITIN) 10 MG tablet Take 1 tablet (10 mg total) by mouth daily. 06/13/17   Brock Bad, MD  norethindrone-ethinyl estradiol 1/35 (ORTHO-NOVUM 1/35, 28,) tablet Take 1 tablet by mouth daily. Patient not taking: Reported on 11/15/2016 04/29/15   Brock Bad, MD  norgestimate-ethinyl estradiol (ORTHO-CYCLEN) 0.25-35 MG-MCG tablet Take 1 tablet by mouth daily. 07/05/18   Brock Bad, MD  Prenat-FeCbn-FeAspGl-FA-Omega (OB COMPLETE PETITE) 35-5-1-200 MG CAPS Take 1 capsule by mouth daily before breakfast. Patient not taking: Reported on 11/15/2016 06/05/14   Brock Bad, MD  tinidazole [facility]) 500 MG tablet Take 2 tablets (1,000 mg total) by mouth daily with breakfast. Patient not taking: Reported on 11/15/2016 06/09/14   Brock Bad, MD  valACYclovir (VALTREX) 1000 MG tablet Take 1 tablet po bid for 3 days prn 06/13/17   Brock Bad, MD      Allergies    Patient has no known allergies.    Review of Systems   Review of Systems  All other systems reviewed and are  negative.   Physical Exam Updated Vital Signs BP 121/78 (BP Location: Right Arm)   Pulse 78   Temp 98.7 F (37.1 C) (Oral)   Resp 16   Ht 5\' 1"  (1.549 m)   Wt 65.8 kg   LMP 12/29/2022 (Exact Date)   SpO2 99%   BMI 27.40 kg/m  Physical Exam Vitals and nursing note reviewed.  Constitutional:      General: She is not in acute distress.    Appearance: Normal appearance.  HENT:     Head: Normocephalic and atraumatic.  Eyes:     General:        Right eye: No discharge.        Left eye: No discharge.  Cardiovascular:     Rate and Rhythm: Normal rate and regular rhythm.     Heart sounds: No murmur heard.    No friction rub. No gallop.  Pulmonary:     Effort: Pulmonary effort is normal.     Breath sounds: Normal breath sounds.  Abdominal:     General: Bowel sounds are normal.     Palpations: Abdomen is soft.  Musculoskeletal:     Comments: Intact strength to flexion, extension of the distal thumb.  Skin:    General: Skin is warm and dry.     Capillary Refill: Capillary refill takes less than 2 seconds.     Comments: Approximately 2 to 3 cm laceration on the palmar surface of the distal left  thumb, wound is clean, hemostatic at time of evaluation but bleeds easily on exam.  No foreign bodies noted.  Neurological:     Mental Status: She is alert and oriented to person, place, and time.  Psychiatric:        Mood and Affect: Mood normal.        Behavior: Behavior normal.     ED Results / Procedures / Treatments   Labs (all labs ordered are listed, but only abnormal results are displayed) Labs Reviewed - No data to display  EKG None  Radiology No results found.  Procedures .Laceration Repair  Date/Time: 12/29/2022 9:29 AM  Performed by: Olene Floss, PA-C Authorized by: Olene Floss, PA-C   Consent:    Consent obtained:  Verbal   Consent given by:  Patient   Risks, benefits, and alternatives were discussed: yes     Risks discussed:   Infection, pain, poor cosmetic result, tendon damage, vascular damage, poor wound healing, need for additional repair and nerve damage   Alternatives discussed:  No treatment Universal protocol:    Procedure explained and questions answered to patient or proxy's satisfaction: yes     Patient identity confirmed:  Verbally with patient Anesthesia:    Anesthesia method:  Local infiltration   Local anesthetic:  Lidocaine 2% WITH epi Laceration details:    Location:  Finger   Finger location:  L thumb   Length (cm):  2.8   Depth (mm):  4 Treatment:    Area cleansed with:  Shur-Clens   Amount of cleaning:  Standard Skin repair:    Repair method:  Sutures   Suture size:  5-0   Suture material:  Chromic gut   Suture technique:  Simple interrupted   Number of sutures:  3 Approximation:    Approximation:  Close Repair type:    Repair type:  Simple Post-procedure details:    Dressing:  Non-adherent dressing   Procedure completion:  Tolerated     Medications Ordered in ED Medications  lidocaine-EPINEPHrine (XYLOCAINE W/EPI) 2 %-1:200000 (PF) injection 10 mL (10 mLs Infiltration Given 12/29/22 0943)  Tdap (BOOSTRIX) injection 0.5 mL (0.5 mLs Intramuscular Given 12/29/22 0944)    ED Course/ Medical Decision Making/ A&P                                 Medical Decision Making Risk Prescription drug management.   This is an overall well-appearing 32yo female who presents with a laceration of the left thumb.  The wound was explored through full range of motion, there is no evidence of foreign body, fascia rupture, damage to the deep vessels, capillary refill is intact distal to the site of the laceration.  Patient is not up-to-date on their tetanus, tetanus will be updated today.  They are neurovascularly intact throughout on my exam, intact strength of the affected extremity   Wound was extensively cleaned, and repaired as described above.  Patient tolerated the procedure without  difficulty.  Nonadherent dressing was placed.  Patient instructed sutures are dissolvable, and told to monitor for signs of infection including worsening pain, redness, pus draining from the affected site.  Instructed to keep the wound clean, bandage, and change the bandage at least once daily.  Patient understands and agrees to the plan, and is discharged in stable condition at this time.  Final Clinical Impression(s) / ED Diagnoses Final diagnoses:  Laceration of left thumb without foreign  body without damage to nail, initial encounter    Rx / DC Orders ED Discharge Orders     None         [place] 12/29/22 1005    Linwood Dibbles, MD 12/29/22 1452

## 2022-12-29 NOTE — ED Notes (Signed)
Thumb dressed with gauze and wrapped with Kerlix
